# Patient Record
Sex: Female | Born: 1985 | Hispanic: Yes | Marital: Single | Smoking: Never smoker
Health system: Southern US, Community
[De-identification: ages and names within clinical notes are randomized; demographics above are authoritative.]

## PROBLEM LIST (undated history)

## (undated) HISTORY — PX: KNEE SURGERY: SHX244

---

## 2011-11-16 ENCOUNTER — Ambulatory Visit: Payer: Self-pay | Admitting: Family Medicine

## 2014-01-06 ENCOUNTER — Inpatient Hospital Stay: Payer: Self-pay | Admitting: Internal Medicine

## 2014-01-06 LAB — URINALYSIS, COMPLETE
BACTERIA: NONE SEEN
Bilirubin,UR: NEGATIVE
Glucose,UR: NEGATIVE mg/dL (ref 0–75)
Nitrite: NEGATIVE
Ph: 7 (ref 4.5–8.0)
Protein: 30
RBC,UR: 9 /HPF (ref 0–5)
SPECIFIC GRAVITY: 1.017 (ref 1.003–1.030)
Squamous Epithelial: 4
WBC UR: 43 /HPF (ref 0–5)

## 2014-01-06 LAB — COMPREHENSIVE METABOLIC PANEL
ALT: 16 U/L (ref 12–78)
Albumin: 3.8 g/dL (ref 3.4–5.0)
Alkaline Phosphatase: 58 U/L
Anion Gap: 6 — ABNORMAL LOW (ref 7–16)
BUN: 8 mg/dL (ref 7–18)
Bilirubin,Total: 0.7 mg/dL (ref 0.2–1.0)
CHLORIDE: 98 mmol/L (ref 98–107)
Calcium, Total: 9.2 mg/dL (ref 8.5–10.1)
Co2: 29 mmol/L (ref 21–32)
Creatinine: 0.95 mg/dL (ref 0.60–1.30)
EGFR (African American): >60
Glucose: 108 mg/dL — ABNORMAL HIGH (ref 65–99)
Osmolality: 265 (ref 275–301)
Potassium: 4 mmol/L (ref 3.5–5.1)
SGOT(AST): 14 U/L — ABNORMAL LOW (ref 15–37)
Sodium: 133 mmol/L — ABNORMAL LOW (ref 136–145)
Total Protein: 8.9 g/dL — ABNORMAL HIGH (ref 6.4–8.2)

## 2014-01-06 LAB — WET PREP, GENITAL

## 2014-01-06 LAB — CBC
HCT: 40.6 % (ref 35.0–47.0)
HGB: 13.1 g/dL (ref 12.0–16.0)
MCH: 29.6 pg (ref 26.0–34.0)
MCHC: 32.2 g/dL (ref 32.0–36.0)
MCV: 92 fL (ref 80–100)
PLATELETS: 265 10*3/uL (ref 150–440)
RBC: 4.42 10*6/uL (ref 3.80–5.20)
RDW: 12.6 % (ref 11.5–14.5)
WBC: 15.4 10*3/uL — ABNORMAL HIGH (ref 3.6–11.0)

## 2014-01-06 LAB — GC/CHLAMYDIA PROBE AMP

## 2014-01-07 LAB — CLOSTRIDIUM DIFFICILE(ARMC)

## 2014-01-08 LAB — CBC WITH DIFFERENTIAL/PLATELET
BASOS ABS: 0 10*3/uL (ref 0.0–0.1)
Basophil %: 0.4 %
Eosinophil #: 0 10*3/uL (ref 0.0–0.7)
Eosinophil %: 0.2 %
HCT: 33.7 % — ABNORMAL LOW (ref 35.0–47.0)
HGB: 11.3 g/dL — ABNORMAL LOW (ref 12.0–16.0)
Lymphocyte #: 2 10*3/uL (ref 1.0–3.6)
Lymphocyte %: 20.2 %
MCH: 30.6 pg (ref 26.0–34.0)
MCHC: 33.5 g/dL (ref 32.0–36.0)
MCV: 91 fL (ref 80–100)
MONOS PCT: 14.3 %
Monocyte #: 1.4 x10 3/mm — ABNORMAL HIGH (ref 0.2–0.9)
Neutrophil #: 6.4 10*3/uL (ref 1.4–6.5)
Neutrophil %: 64.9 %
PLATELETS: 230 10*3/uL (ref 150–440)
RBC: 3.69 10*6/uL — ABNORMAL LOW (ref 3.80–5.20)
RDW: 12.4 % (ref 11.5–14.5)
WBC: 9.9 10*3/uL (ref 3.6–11.0)

## 2014-01-09 LAB — URINE CULTURE

## 2014-01-10 LAB — STOOL CULTURE

## 2014-01-12 LAB — CULTURE, BLOOD (SINGLE)

## 2015-01-01 ENCOUNTER — Emergency Department
Admit: 2015-01-01 | Disposition: A | Discharge status: Home / Self Care | Payer: Self-pay | Admitting: Emergency Medicine

## 2015-01-01 LAB — COMPREHENSIVE METABOLIC PANEL
ALK PHOS: 50 U/L
Albumin: 4.7 g/dL
Anion Gap: 7 (ref 7–16)
BILIRUBIN TOTAL: 0.7 mg/dL
BUN: 14 mg/dL
CHLORIDE: 102 mmol/L
CREATININE: 0.77 mg/dL
Calcium, Total: 9.4 mg/dL
Co2: 25 mmol/L
EGFR (African American): >60
EGFR (Non-African Amer.): >60
GLUCOSE: 126 mg/dL — AB
Potassium: 3.8 mmol/L
SGOT(AST): 25 U/L
SGPT (ALT): 16 U/L
SODIUM: 134 mmol/L — AB
TOTAL PROTEIN: 8.5 g/dL — AB

## 2015-01-01 LAB — URINALYSIS, COMPLETE
Bilirubin,UR: NEGATIVE
Glucose,UR: NEGATIVE mg/dL (ref 0–75)
LEUKOCYTE ESTERASE: NEGATIVE
NITRITE: NEGATIVE
PH: 5 (ref 4.5–8.0)
PROTEIN: NEGATIVE
SPECIFIC GRAVITY: 1.02 (ref 1.003–1.030)

## 2015-01-01 LAB — CBC WITH DIFFERENTIAL/PLATELET
BASOS ABS: 0 10*3/uL (ref 0.0–0.1)
BASOS PCT: 0.3 %
EOS PCT: 0.1 %
Eosinophil #: 0 10*3/uL (ref 0.0–0.7)
HCT: 39.6 % (ref 35.0–47.0)
HGB: 13.6 g/dL (ref 12.0–16.0)
LYMPHS ABS: 0.9 10*3/uL — AB (ref 1.0–3.6)
LYMPHS PCT: 7.8 %
MCH: 30.5 pg (ref 26.0–34.0)
MCHC: 34.2 g/dL (ref 32.0–36.0)
MCV: 89 fL (ref 80–100)
MONOS PCT: 5.5 %
Monocyte #: 0.6 x10 3/mm (ref 0.2–0.9)
Neutrophil #: 9.9 10*3/uL — ABNORMAL HIGH (ref 1.4–6.5)
Neutrophil %: 86.3 %
Platelet: 266 10*3/uL (ref 150–440)
RBC: 4.45 10*6/uL (ref 3.80–5.20)
RDW: 12.7 % (ref 11.5–14.5)
WBC: 11.5 10*3/uL — AB (ref 3.6–11.0)

## 2015-01-01 LAB — LIPASE, BLOOD: LIPASE: 25 U/L

## 2015-01-01 LAB — TROPONIN I: Troponin-I: <0.03 ng/mL

## 2015-01-12 NOTE — H&P (Signed)
PATIENT NAMEMARAL, [name] MR#:  [id number] DATE OF BIRTH:  12/10/1985  DATE OF ADMISSION:  01/06/2014  REFERRING PHYSICIAN:  Dr. Joni Fears.  PRIMARY CARE PHYSICIAN:  Dr. Orlene Erm, from [facility] office.   CHIEF COMPLAINT:  Abdominal pain, flank pain, fever.   HISTORY OF PRESENT ILLNESS:  This is a 29 year old female without significant past medical history who presents with complaints of abdominal pain and flank pain and fever for the last two days, the patient reports she has been having diffuse abdominal pain, having fever and chills at home, as well she has been having nausea and vomiting, so far three episodes today, as well reports generalized weakness, loss of appetite, denies any diarrhea, any coffee-ground emesis, any cough, any productive sputum, reports dysuria and polyuria as well, the patient had blood work-up done in ED which did show evidence of leukocytosis at 15,000.  Urinalysis was positive was 43 white blood cells and +1 leukocyte esterase, the patient had CT abdomen and pelvis done which did show evidence of left-sided pyelonephritis, but no evidence of abscess or hydronephrosis, as well the patient had pelvic ultrasound done which did not show any acute finding as well, the patient had a pelvic exam done by the physician assistant which the patient did not have any cervical motion tenderness.  The patient continued to have nausea and vomiting in the ED despite receiving multiple treatments for nausea, so hospitalist requested to admit.   PAST MEDICAL HISTORY:  None.   PAST SURGICAL HISTORY:  Right knee surgery.   ALLERGIES:  No known drug allergies.   HOME MEDICATIONS:  None.   SOCIAL HISTORY:  The patient denies any smoking, any alcohol, any illicit drug use.   FAMILY HISTORY:  Denies any family history of diabetes or hypertension.   REVIEW OF SYSTEMS: CONSTITUTIONAL:  Reports fever, chills, weakness, poor appetite.  EYES:  Denies blurry vision, double  vision, inflammation, glaucoma.  EARS, NOSE, THROAT:  Denies tinnitus, ear pain, hearing loss, epistaxis or discharge.  RESPIRATORY:  Denies cough, wheezing, hemoptysis, dyspnea.  CARDIOVASCULAR:  Denies chest pain, orthopnea, edema, palpitations, syncope.  GASTROINTESTINAL:  Reports nausea, vomiting, abdominal pain.  Denies hematemesis, diarrhea, melena, or rectal bleed.  GENITOURINARY:  Reports dysuria, polyuria and renal colic.  Left flank pain.   ENDOCRINE:  Denies heat or cold intolerance.  Reports polyuria.  HEMATOLOGY:  Denies anemia, easy bruising, bleeding diathesis.  INTEGUMENTARY:  Denies acne, rash or skin lesion.  MUSCULOSKELETAL:  Denies any swelling, gout, arthritis, cramps.  NEUROLOGIC:  Denies any tremors, vertigo, ataxia, dysarthria, focal deficits.  Reports headache.  PSYCHIATRIC:  Denies anxiety, insomnia, depression, substance abuse, alcohol abuse.   PHYSICAL EXAMINATION: VITAL SIGNS:  Temperature 103.1, pulse 121, respiratory rate 20, blood pressure 120/73, saturating 100% on room air.  GENERAL:  Well-nourished female who looks comfortable in bed, in no apparent distress.  HEENT:  Head atraumatic, normocephalic.  Pupils equal, reactive to light.  Pink conjunctivae.  Anicteric sclerae.  Dry oral mucosa.  NECK:  Supple.  No thyromegaly.  No JVD.   CHEST:  Good air entry bilaterally.  No wheezing, rales, or rhonchi.  CARDIOVASCULAR:  S1, S2 heard.  No rubs, murmurs or gallops.  ABDOMEN:  Has diffuse abdominal tenderness and has bilateral CVA tenderness, but much more pronounced on the left than the right.  EXTREMITIES:  No edema.  No clubbing.  No cyanosis.  Radial and pedal pulses +2 bilaterally.  PSYCHIATRIC:  Appropriate affect.  Awake, alert x 3.  Intact judgment and insight.  NEUROLOGIC:  Cranial nerves grossly intact.  Motor 5 out of 5.  No focal deficits.  SKIN:  Dry, delayed skin turgor.  MUSCULOSKELETAL:  No joint effusion or erythema.   PERTINENT LABORATORY  DATA:  Glucose 108, BUN 8, creatinine 0.95, sodium 133, potassium 4, chloride 98, CO2 29, ALT 16, AST 14, alk phos 58.  White blood cell 15.4, hemoglobin 13.1, hematocrit 40.6, platelets 265.  Urinalysis showing 43 white blood cells, +1 leukocyte esterase.  CT abdomen and pelvis showing left acute pyelonephritis.  No abscess or hydronephrosis.  Pelvic ultrasound showing IUD in place within the endometrium, unremarkable study.   ASSESSMENT AND PLAN: 1.  Sepsis, the patient presents with leukocytosis, fever, and tachycardia, her source of sepsis is most likely related to acute pyelonephritis.  2.  Acute pyelonephritis, the patient will be started on aggressive intravenous fluid hydration and intravenous Rocephin.  We will follow on the urine culture, adjust antibiotics if needed.  3.  Dehydration, the patient appears to be clinically dehydrated.  We will have her on aggressive intravenous fluids.  4.  Nausea and vomiting, and abdominal, this is related to her acute pyelonephritis.  We will continue with as needed pain medicine and nausea medicine.  5.  Deep vein thrombosis prophylaxis.  SubQ heparin.  6.  CODE STATUS:  FULL CODE.   Total time spent on admission and patient care 35 minutes.    ____________________________ Albertine Patricia, MD dse:ea D: 01/06/2014 23:32:06 ET T: 01/07/2014 02:14:18 ET JOB#: [id number]  cc: Albertine Patricia, MD, <Dictator> Nicloe Frontera Graciela Husbands MD ELECTRONICALLY SIGNED 01/13/2014 1:01

## 2015-01-12 NOTE — Discharge Summary (Signed)
PATIENT NAMNevin Rodgers:  Stepp, Vana MR#:  [id number] DATE OF BIRTH:  05/17/1986  DATE OF ADMISSION:  01/06/2014 DATE OF DISCHARGE:  01/09/2014  DISCHARGE DIAGNOSES:  1.  Sepsis secondary to Escherichia coli urinary tract infection. Sepsis present on admission.  2.  Acute pyelonephritis.  3.  A furuncle in the right nostril.   DISCHARGE MEDICATIONS:  1.  Zofran 4 mg p.o. t.i.d. as needed.  2.  Amoxicillin 500 mg p.o. t.i.d.  3.  Fioricet 1 tablet every 12 hours as needed for headache.   CONSULTATIONS:  None.   HOSPITAL COURSE:  The patient is a 29 year old female patient who had abdominal pain, flank pain and fever. The patient had left-sided flank pain along with some nausea, vomiting, and generalized weakness and poor appetite. The patient had a white count of 15,000 and UA was positive for 43 WBCs and leukocyte esterase. A CT of the lung showed a left-sided pyelonephritis on admission. The patient did not have any evidence for hydronephrosis. She was admitted to the hospital care service because of her acute pyelonephritis.  she  had tachycardia with her heart rate 121 and temperature of 102.1 on admission, so she is admitted for sepsis likely due to pyelonephritis, started on IV Rocephin along with IV fluids and IV pain medications. The patient's symptoms nicely improved with antibiotics and IV fluids. Yesterday, she does not have any fever, but had some flank pain which improved in the next 2 days. The patient had some diarrhea in the hospital after the day of admission. The patient's stools were sent for Clostridium difficile, which were negative. The patient developed a furuncle in her right nostril, which was slightly tender to palpation. It did not look like abscess so the patient was given some warm compressions to the furuncle of  the nostril.  her symptoms nicely improved the following day. I discharged her home with antibiotics, amoxicillin, and her urine culture showed E. coli less than 80,000  colonies and she also has some migraines and we gave her a dose of Fioricet, which helped her. So I gave a prescription for Fioricet and I discharged her home in stable condition.   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  2.  Amoxicillin is given for 10 days.  3.  Fioricet is given for 5 days and I gave her 10 tablets.  4.   Zofran is given for 12 tablets.  The patient went home in stable condition. White count on discharge 9.9.   DISCHARGE PHYSICAL EXAMINATION:  CARDIOVASCULAR:   s1,s2 regular  LUNGS:  Clear to auscultation.  ABDOMEN:  Does not have any tenderness. No costovertebral angle tenderness and the patient has no hernias to exam.  NOSE: Still showed furuncle on the right side but nontender to palpation and redness has been resolved. VITAL SIGNS:  Heart rate was 50 and discharge temperature 97.7 and blood pressure 94/61.    DISPOSITION:  The patient went home in stable condition.   TIME SPENT:  More than 30 minutes.    ____________________________ Katha HammingSnehalatha Coren Sagan, MD sk:jm D: 01/13/2014 07:19:00 ET T: 01/13/2014 16:04:55 ET JOB#: [id number]  cc: Volney Americanhristina Drostin, MD  Katha HammingSNEHALATHA Anise Harbin MD ELECTRONICALLY SIGNED 01/23/2014 13:21

## 2015-01-12 NOTE — Discharge Summary (Signed)
PATIENT NAMNevin Rodgers:  Fleury, Vashti MR#:  [id number] DATE OF BIRTH:  1986-02-09  DATE OF ADMISSION:  01/06/2014 DATE OF DISCHARGE:  01/09/2014  DISCHARGE DIAGNOSES:  1.  Sepsis secondary to Escherichia coli urinary tract infection. Sepsis present on admission.  2.  Acute pyelonephritis.  3.  A furuncle in the right nostril.   DISCHARGE MEDICATIONS:  1.  Zofran 4 mg p.o. t.i.d. as needed.  2.  Amoxicillin 500 mg p.o. t.i.d.  3.  Fioricet 1 tablet every 12 hours as needed for headache.   CONSULTATIONS:  None.   HOSPITAL COURSE:  The patient is a 29 year old female patient who had abdominal pain, flank pain and fever. The patient had left-sided flank pain along with some nausea, vomiting, and generalized weakness and poor appetite. The patient had a white count of 15,000 and UA was positive for 43 WBCs and leukocyte esterase. A CT of the lung showed a left-sided pyelonephritis on admission. The patient did not have any evidence for hydronephrosis. She was admitted to the hospital care service because of her acute pyelonephritis. Her labs were showing a little kidney function but (Dictation Anomaly)<<MISSING TEXT>> and she also had tachycardia with her heart rate 121 and temperature of 102.1 on admission, so she is admitted for sepsis likely due to pyelonephritis, started on IV Rocephin along with IV fluids and IV pain medications. The patient's symptoms nicely improved with antibiotics and IV fluids. Yesterday, she does not have any fever, but had some flank pain which improved in the next 2 days. The patient had some diarrhea in the hospital after the day of admission. The patient's stools were sent for Clostridium difficile, which were negative. The patient developed a furuncle in her right nostril, which was slightly tender to palpation. It did not look like abscess so the patient was given some warm compressions to the furuncle on the nostril but (Dictation Anomaly) <<MISSING TEXT>> did well and her symptoms  nicely improved the following day. I discharged her home with antibiotics, amoxicillin, and her urine culture showed E. coli less than 80,000 colonies and she also has some migraines and we gave her a dose of Fioricet, which helped her. So I gave a prescription for Fioricet and I discharged her home in stable condition.   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  2.  Amoxicillin is given for 10 days.  3.  Fioricet is given for 5 days and I gave her 10 tablets.  4.   Zofran is given for 12 tablets.  The patient went home in stable condition. White count on discharge 9.9.   DISCHARGE PHYSICAL EXAMINATION:  CARDIOVASCULAR:   (Dictation Anomaly) <<MISSING TEXT>>.  LUNGS:  Clear to auscultation.  ABDOMEN:  Does not have any tenderness. No costovertebral angle tenderness and the patient has no hernias to exam.  NOSE: Still showed furuncle on the right side but nontender to palpation and redness has been resolved. VITAL SIGNS:  Heart rate was 50 and discharge temperature 97.7 and blood pressure 94/61.    DISPOSITION:  The patient went home in stable condition.   TIME SPENT:  More than 30 minutes.    ____________________________ Katha HammingSnehalatha Philomena Buttermore, MD sk:jm D: 01/13/2014 07:19:00 ET T: 01/13/2014 16:04:55 ET JOB#: [id number]  cc: Katha HammingSnehalatha Abdur Hoglund, MD, <Dictator> Volney Americanhristina Drostin, MD

## 2015-04-30 ENCOUNTER — Other Ambulatory Visit: Payer: Self-pay

## 2015-04-30 ENCOUNTER — Emergency Department: Payer: BLUE CROSS/BLUE SHIELD

## 2015-04-30 ENCOUNTER — Encounter: Payer: Self-pay | Admitting: Urgent Care

## 2015-04-30 ENCOUNTER — Emergency Department
Admission: EM | Admit: 2015-04-30 | Discharge: 2015-05-01 | Disposition: A | Discharge status: Home / Self Care | Payer: BLUE CROSS/BLUE SHIELD | Attending: Emergency Medicine | Admitting: Emergency Medicine

## 2015-04-30 DIAGNOSIS — Z3202 Encounter for pregnancy test, result negative: Secondary | ICD-10-CM | POA: Diagnosis not present

## 2015-04-30 DIAGNOSIS — B349 Viral infection, unspecified: Secondary | ICD-10-CM | POA: Insufficient documentation

## 2015-04-30 DIAGNOSIS — R079 Chest pain, unspecified: Secondary | ICD-10-CM | POA: Insufficient documentation

## 2015-04-30 LAB — BASIC METABOLIC PANEL
Anion gap: 10 (ref 5–15)
BUN: 15 mg/dL (ref 6–20)
CO2: 24 mmol/L (ref 22–32)
Calcium: 9.4 mg/dL (ref 8.9–10.3)
Chloride: 102 mmol/L (ref 101–111)
Creatinine, Ser: 1.03 mg/dL — ABNORMAL HIGH (ref 0.44–1.00)
GFR calc Af Amer: >60 mL/min (ref >60)
GFR calc non Af Amer: >60 mL/min (ref >60)
Glucose, Bld: 119 mg/dL — ABNORMAL HIGH (ref 65–99)
POTASSIUM: 3.6 mmol/L (ref 3.5–5.1)
SODIUM: 136 mmol/L (ref 135–145)

## 2015-04-30 LAB — TROPONIN I: Troponin I: <0.03 ng/mL (ref <0.031)

## 2015-04-30 LAB — CBC
HCT: 37.7 % (ref 35.0–47.0)
HEMOGLOBIN: 12.9 g/dL (ref 12.0–16.0)
MCH: 30.4 pg (ref 26.0–34.0)
MCHC: 34.2 g/dL (ref 32.0–36.0)
MCV: 88.7 fL (ref 80.0–100.0)
Platelets: 239 10*3/uL (ref 150–440)
RBC: 4.25 MIL/uL (ref 3.80–5.20)
RDW: 12.9 % (ref 11.5–14.5)
WBC: 12 10*3/uL — AB (ref 3.6–11.0)

## 2015-04-30 MED ORDER — ACETAMINOPHEN 325 MG PO TABS
650.0000 mg | ORAL_TABLET | Freq: Once | ORAL | Status: AC
  Administered 2015-04-30: 650 mg via ORAL
  Filled 2015-04-30: qty 2

## 2015-04-30 MED ORDER — ONDANSETRON HCL 4 MG PO TABS: 4.0000 mg | ORAL_TABLET | Freq: Every day | ORAL | Status: AC | PRN

## 2015-04-30 MED ORDER — ONDANSETRON HCL 4 MG PO TABS
4.0000 mg | ORAL_TABLET | Freq: Once | ORAL | Status: AC
  Administered 2015-05-01: 4 mg via ORAL
  Filled 2015-04-30: qty 1

## 2015-04-30 MED ORDER — GI COCKTAIL ~~LOC~~
30.0000 mL | Freq: Once | ORAL | Status: AC
  Administered 2015-04-30: 30 mL via ORAL
  Filled 2015-04-30: qty 30

## 2015-04-30 NOTE — ED Provider Notes (Signed)
[facility] Emergency Department Provider Note  ____________________________________________  Time seen: Approximately 950 PM  I have reviewed the triage vital signs and the nursing notes.   HISTORY  Chief Complaint Emesis; Fever; and Chest Pain     HPI [name] is a 29 y.o. female without pertinent medical history who presents with 3 days of midsternal, sharp chest pain which radiates to her back. She says that she is also feeling intermittent palpitations and pain with deep breathing. She says that she also has associated nausea and vomiting and a runny nose with fever. Denies any diarrhea. No blood in the vomitus. Max temp was 101. No known sick contacts or recent antibiotics.   History reviewed. No pertinent past medical history.  There are no active problems to display for this patient.   Past Surgical History  Procedure Laterality Date  . Knee surgery Right     No current outpatient prescriptions on file.  Allergies Review of patient's allergies indicates no known allergies.  No family history on file.  Social History History  Substance Use Topics  . Smoking status: Never Smoker   . Smokeless tobacco: Not on file  . Alcohol Use: No    Review of Systems Constitutional: Reports fever Eyes: No visual changes. ENT: No sore throat. Cardiovascular: As above  Respiratory: Denies shortness of breath. Gastrointestinal: No abdominal pain.  No nausea, no vomiting.  No diarrhea.  No constipation. Genitourinary: Negative for dysuria. Musculoskeletal: Negative for back pain. Skin: Negative for rash. Neurological: Negative for headaches, focal weakness or numbness.  10-point ROS otherwise negative.  ____________________________________________   PHYSICAL EXAM:  VITAL SIGNS: ED Triage Vitals  Enc Vitals Group     BP 04/30/15 2135 107/69 mmHg     Pulse Rate 04/30/15 2135 106     Resp 04/30/15 2135 20     Temp 04/30/15 2135 99.8 F  (37.7 C)     Temp Source 04/30/15 2135 Oral     SpO2 04/30/15 2135 99 %     Weight 04/30/15 2135 138 lb (62.596 kg)     Height 04/30/15 2135 5\' 3"  (1.6 m)     Head Cir --      Peak Flow --      Pain Score 04/30/15 2136 10     Pain Loc --      Pain Edu? --      Excl. in GC? --     Constitutional: Alert and oriented. Well appearing and in no acute distress. Eyes: Conjunctivae are normal. PERRL. EOMI. Head: Atraumatic. Nose: No congestion/rhinnorhea. Mouth/Throat: Mucous membranes are moist.  Oropharynx non-erythematous. Neck: No stridor.   Cardiovascular: Normal rate, regular rhythm. Heart rate 93 at the bedside. Grossly normal heart sounds.  Good peripheral circulation. Chest pain is reproducible over the sternum to palpation. Respiratory: Normal respiratory effort.  No retractions. Lungs CTAB. Gastrointestinal: Soft and nontender. No distention. No abdominal bruits. No CVA tenderness. Musculoskeletal: No lower extremity tenderness nor edema.  No joint effusions. Neurologic:  Normal speech and language. No gross focal neurologic deficits are appreciated. No gait instability. Skin:  Skin is warm, dry and intact. No rash noted. Psychiatric: Mood and affect are normal. Speech and behavior are normal.  ____________________________________________   LABS (all labs ordered are listed, but only abnormal results are displayed)  Labs Reviewed  BASIC METABOLIC PANEL - Abnormal; Notable for the following:    Glucose, Bld 119 (*)    Creatinine, Ser 1.03 (*)    All  other components within normal limits  CBC - Abnormal; Notable for the following:    WBC 12.0 (*)    All other components within normal limits  TROPONIN I   ____________________________________________  EKG  ED ECG REPORT I, Schaevitz,  Teena Irani, the attending physician, personally viewed and interpreted this ECG.   Date: 04/30/2015  EKG Time: 2139  Rate: 101  Rhythm: sinus tachycardia  Axis: Normal axis   Intervals:none  ST&T Change: No ST elevations or depressions. T-wave inversions in 3, aVF and V3. No previous for comparison.    ____________________________________________  RADIOLOGY  No active cardiopulmonary disease on the chest x-ray. I personally reviewed the images. ____________________________________________   PROCEDURES    ____________________________________________   INITIAL IMPRESSION / ASSESSMENT AND PLAN / ED COURSE  Pertinent labs & imaging results that were available during my care of the patient were reviewed by me and considered in my medical decision making (see chart for details).  ----------------------------------------- 11:33 PM on 04/30/2015 -----------------------------------------  Try patient with GI cocktail without any relief of her pain. Likely viral syndrome causing constellation of symptoms. However, cannot rule out pulmonary embolus. Pending urinary pregnancy test as well as d-dimer at this time. Signed out to Dr. Dolores Frame. Nonspecific EKG changes on her chest x-ray. Unclear significance but doubt coronary artery disease. Furthermore, has a negative troponin. Would suspect elevated troponin after multiple days of chest pain. No history of cardiac disease in the family. ____________________________________________   FINAL CLINICAL IMPRESSION(S) / ED DIAGNOSES  Acute viral syndrome. Acute chest pain. Initial visit.    Myrna Blazer, MD 04/30/15 [phone]

## 2015-04-30 NOTE — ED Notes (Addendum)
Patient presents with c/o central CP that began earlier today. Chest is tender to palpation per her report. (+) N/V and fever also reports; tmax 101. (+) SOB.

## 2015-05-01 LAB — HEPATIC FUNCTION PANEL
ALT: 13 U/L — AB (ref 14–54)
AST: 23 U/L (ref 15–41)
Albumin: 4.4 g/dL (ref 3.5–5.0)
Alkaline Phosphatase: 49 U/L (ref 38–126)
Bilirubin, Direct: <0.1 mg/dL — ABNORMAL LOW (ref 0.1–0.5)
Total Bilirubin: 0.4 mg/dL (ref 0.3–1.2)
Total Protein: 8.1 g/dL (ref 6.5–8.1)

## 2015-05-01 LAB — LIPASE, BLOOD: LIPASE: 16 U/L — AB (ref 22–51)

## 2015-05-01 LAB — POCT PREGNANCY, URINE: PREG TEST UR: NEGATIVE

## 2015-05-01 LAB — FIBRIN DERIVATIVES D-DIMER (ARMC ONLY): Fibrin derivatives D-dimer (ARMC): 401 (ref 0–499)

## 2015-05-01 NOTE — ED Provider Notes (Signed)
-----------------------------------------   00:45 AM on 05/01/2015 -----------------------------------------  Apologized for delay in lab results secondary to computer downtime. Patient is currently resting in no acute distress. Remains afebrile.  ----------------------------------------- 1:58 AM on 05/01/2015 -----------------------------------------  Updated patient and family of lab results. Negative d-dimer, LFTs and lipase unremarkable. Strict return precautions given. Both verbalize understanding and agree with plan of care.  Irean Hong, MD 05/01/15 [phone]

## 2015-05-01 NOTE — ED Notes (Signed)
md in with pt

## 2016-02-23 ENCOUNTER — Encounter: Payer: Self-pay | Admitting: Gynecology

## 2016-02-23 ENCOUNTER — Ambulatory Visit (INDEPENDENT_AMBULATORY_CARE_PROVIDER_SITE_OTHER): Payer: BLUE CROSS/BLUE SHIELD

## 2016-02-23 ENCOUNTER — Ambulatory Visit
Admission: EM | Admit: 2016-02-23 | Discharge: 2016-02-23 | Disposition: A | Discharge status: Home / Self Care | Payer: BLUE CROSS/BLUE SHIELD | Attending: Family Medicine | Admitting: Family Medicine

## 2016-02-23 DIAGNOSIS — M436 Torticollis: Secondary | ICD-10-CM | POA: Insufficient documentation

## 2016-02-23 DIAGNOSIS — R509 Fever, unspecified: Secondary | ICD-10-CM

## 2016-02-23 LAB — URINALYSIS COMPLETE WITH MICROSCOPIC (ARMC ONLY)
Bilirubin Urine: NEGATIVE
Glucose, UA: NEGATIVE mg/dL
Nitrite: NEGATIVE
Protein, ur: NEGATIVE mg/dL
Specific Gravity, Urine: <1.005 — ABNORMAL LOW (ref 1.005–1.030)
pH: 6.5 (ref 5.0–8.0)

## 2016-02-23 LAB — RAPID INFLUENZA A&B ANTIGENS
Influenza A (ARMC): NEGATIVE
Influenza B (ARMC): NEGATIVE

## 2016-02-23 LAB — RAPID STREP SCREEN (MED CTR MEBANE ONLY): Streptococcus, Group A Screen (Direct): NEGATIVE

## 2016-02-23 MED ORDER — IBUPROFEN 400 MG PO TABS
400.0000 mg | ORAL_TABLET | Freq: Once | ORAL | Status: AC
  Administered 2016-02-23: 400 mg via ORAL

## 2016-02-23 NOTE — ED Provider Notes (Signed)
CSN: [id number]     Arrival date & time 02/23/16  1318 History   First MD Initiated Contact with Patient 02/23/16 1505    Nurses notes were reviewed. Chief Complaint  Patient presents with  . Generalized Body Aches   Mother and patient reports having a high fever like this about 2 years ago she had a polynephritis and one up in the hospital for about 4 days. She reports Friday started running a fever. Her coworkers thought that she looked ill. She has had body aches and myalgia and a sore throat. She denies any cough burning on urination and vaginal discharge or dyspareunia. Her last menstrual period was about a week ago and normal. She is a gravida 1 para is 1. She denies any cough congestion and denies any dysuria or frequency at this time unlike 2 years ago when she was admitted for polynephritis.  She's had knee surgery and no other surgery she does not smoke. No significant or pertinent family medical history relating to this visit no known drug allergies.   (Consider location/radiation/quality/duration/timing/severity/associated sxs/prior Treatment) Patient is a 30 y.o. female presenting with fever. The history is provided by the patient and a parent. No language interpreter was used.  Fever Max temp prior to arrival:  103 Temp source:  Oral Duration:  3 days Progression:  Worsening Relieved by:  Nothing Associated symptoms: chills, myalgias, nausea and sore throat   Associated symptoms: no chest pain, no confusion, no congestion, no cough, no dysuria, no ear pain, no rash, no rhinorrhea and no vomiting   Myalgias:    Location:  Generalized   Quality:  Aching   Onset quality:  Sudden   Progression:  Worsening Risk factors: no hx of cancer, no immunosuppression, no occupational exposure, no recent surgery, no recent travel and no sick contacts     History reviewed. No pertinent past medical history. Past Surgical History  Procedure Laterality Date  . Knee surgery Right    No  family history on file. Social History  Substance Use Topics  . Smoking status: Never Smoker   . Smokeless tobacco: None  . Alcohol Use: No   OB History    No data available     Review of Systems  Constitutional: Positive for fever and chills.  HENT: Positive for sore throat. Negative for congestion, ear pain and rhinorrhea.   Respiratory: Negative for cough.   Cardiovascular: Negative for chest pain.  Gastrointestinal: Positive for nausea. Negative for vomiting.  Genitourinary: Negative for dysuria, urgency, hematuria, decreased urine volume, vaginal bleeding, vaginal discharge, difficulty urinating, vaginal pain, menstrual problem and pelvic pain.  Musculoskeletal: Positive for myalgias.  Skin: Negative for color change, pallor and rash.  Neurological: Negative for tremors and weakness.  Psychiatric/Behavioral: Negative for confusion.  All other systems reviewed and are negative.   Allergies  Review of patient's allergies indicates no known allergies.  Home Medications   Prior to Admission medications   Medication Sig Start Date End Date Taking? Authorizing Provider  ibuprofen (ADVIL,MOTRIN) 200 MG tablet Take 200 mg by mouth every 6 (six) hours as needed.    Historical Provider, MD  ondansetron (ZOFRAN) 4 MG tablet Take 1 tablet (4 mg total) by mouth daily as needed for nausea or vomiting. 04/30/15   Myrna Blazeravid Matthew Schaevitz, MD   Meds Ordered and Administered this Visit   Medications  ibuprofen (ADVIL,MOTRIN) tablet 400 mg (400 mg Oral Given 02/23/16 1439)    BP 136/121 mmHg  Pulse 111  Temp(Src)  100.2 F (37.9 C) (Oral)  Resp 16  Ht  (1.6 m)  Wt 134 lb (60.782 kg)  BMI 23.74 kg/m2  SpO2 100%  LMP 02/13/2016 No data found.   Physical Exam  Constitutional: She is oriented to person, place, and time. She appears well-developed and well-nourished.  HENT:  Head: Normocephalic and atraumatic.  Right Ear: External ear normal.  Left Ear: External ear normal.   Eyes: Pupils are equal, round, and reactive to light.  Neck: Normal range of motion. Neck supple.  Cardiovascular: Normal rate and regular rhythm.   Pulmonary/Chest: Effort normal and breath sounds normal.  Abdominal: Soft. Bowel sounds are normal. She exhibits no distension.  Musculoskeletal: Normal range of motion.  Lymphadenopathy:    She has cervical adenopathy.  Neurological: She is alert and oriented to person, place, and time.  Skin: Skin is warm.  Psychiatric: She has a normal mood and affect.  Vitals reviewed.   ED Course  Procedures (including critical care time)  Labs Review Labs Reviewed  URINALYSIS COMPLETEWITH MICROSCOPIC ([facility] ONLY) - Abnormal; Notable for the following:    Color, Urine STRAW (*)    Ketones, ur 2+ (*)    Specific Gravity, Urine <1.005 (*)    Hgb urine dipstick 2+ (*)    Leukocytes, UA TRACE (*)    Bacteria, UA FEW (*)    Squamous Epithelial / LPF 6-30 (*)    All other components within normal limits  RAPID INFLUENZA A&B ANTIGENS ([facility] ONLY)  RAPID STREP SCREEN (NOT AT [facility])  CULTURE, GROUP A STREP [facility])  URINE CULTURE    Imaging Review Dg Chest 2 View  02/23/2016  CLINICAL DATA:  Fever for 3 days EXAM: CHEST  2 VIEW COMPARISON:  April 30, 2015 FINDINGS: There is a calcified granuloma in the left upper lobe, stable. There is no edema or consolidation. Heart size and pulmonary vascularity are normal. No adenopathy. No bone lesions. IMPRESSION: Small calcified granuloma left upper lobe. No edema or consolidation. Electronically Signed   By: Bretta Bang III M.D.   On: 02/23/2016 16:08     Visual Acuity Review  Right Eye Distance:   Left Eye Distance:   Bilateral Distance:    Right Eye Near:   Left Eye Near:    Bilateral Near:     Results for orders placed or performed during the hospital encounter of 02/23/16  Rapid Influenza A&B Antigens ([facility] only)  Result Value Ref Range   Influenza A ([facility]) NEGATIVE NEGATIVE   Influenza B  ([facility]) NEGATIVE NEGATIVE  Rapid strep screen  Result Value Ref Range   Streptococcus, Group A Screen (Direct) NEGATIVE NEGATIVE  Urinalysis complete, with microscopic  Result Value Ref Range   Color, Urine STRAW (A) YELLOW   APPearance CLEAR CLEAR   Glucose, UA NEGATIVE NEGATIVE mg/dL   Bilirubin Urine NEGATIVE NEGATIVE   Ketones, ur 2+ (A) NEGATIVE mg/dL   Specific Gravity, Urine <1.005 (L) 1.005 - 1.030   Hgb urine dipstick 2+ (A) NEGATIVE   pH 6.5 5.0 - 8.0   Protein, ur NEGATIVE NEGATIVE mg/dL   Nitrite NEGATIVE NEGATIVE   Leukocytes, UA TRACE (A) NEGATIVE   RBC / HPF 0-5 0 - 5 RBC/hpf   WBC, UA 6-30 0 - 5 WBC/hpf   Bacteria, UA FEW (A) NONE SEEN   Squamous Epithelial / LPF 6-30 (A) NONE SEEN      MDM   1. Stiffness of neck   2. Fever of unknown origin  After examination only finding pain in her neck and some stiffness of the neck. Examination I discussed with the mother need or the suggestion to go ahead and go to the ED for spinal tap and evaluation for meningitis. It was at that time the mother is now for me that a year ago she had a similar presentation spinal tap but no etiology was found and she was sent home and did fine. Explained to the mother even though this bowel tap was unremarkable a year ago meningitis can be fatal and can be quickly fatal. At this time the only thing we can find that significant is the neck stiffness and neck pain. I was still suggest a spinal tap. However after discussion with her and her mother I will do a urine and a chest x-ray. If no signs of a cause of fever can be found my suggestion will be to go to the ED.   Chest x-ray showed an old granuloma and urinalysis was unremarkable except for ketones but she is very told me that she did not have an appetite and she's not eating. Unfortunately she had a significant amount of epithelial cells so the bacteria that she has may not be significant. Will culture urine the mother is now asked what  would I suggest and has before I suggest they go to one of the [facility] hospitals for probable spinal tap. They're now willing to go to [facility] for further evaluation.  I decided not to do CBC because whether the CBC is elevated or low would recommend at this time LP.  Note: This dictation was prepared with Dragon dictation along with smaller phrase technology. Any transcriptional errors that result from this process are unintentional.  Hassan Rowan, MD 02/23/16 1655

## 2016-02-23 NOTE — ED Notes (Signed)
Patient c/o chills and body ache x 3 days.

## 2016-02-23 NOTE — Discharge Instructions (Signed)
[employer] (Fever, Adult) La fiebre es el aumento de la Arts development officer. A menudo se la define como una temperatura de 100F (38C) o ms. Los episodios de fiebre leve o moderada que duran poco tiempo no tienen efectos a Air cabin crew y, en general, tampoco requieren TEFL teacher. Los episodios de fiebre moderada o alta pueden resultar molestos. A veces, tambin pueden ser un signo de una enfermedad grave. La sudoracin que se puede presentar cuando los episodios de fiebre son reiterados o duran algn tiempo tambin pueden llevar a que falte lquido en el organismo (deshidratacin). Puede usar un termmetro para tomarse la temperatura y saber si tiene fiebre. La medicin de la temperatura puede cambiar en funcin de los siguientes factores:  La edad.  La hora del da.  El lugar donde se coloca el termmetro:  La boca (oral).  El recto (rectal).  El odo (timpnico).  Debajo del brazo Administrator, Civil Service).  La frente (temporal). CUIDADOS EN EL HOGAR Est atento a cualquier cambio en los sntomas. Tome estas medidas para controlar la afeccin:  Tome los medicamentos de venta libre y los recetados solamente como se lo haya indicado el mdico. Siga cuidadosamente las indicaciones en cuanto a la dosis.  Si le recetaron un antibitico, tmelo como se lo haya indicado el mdico. No deje de tomar los antibiticos aunque comience a Actor.  Descanse todo lo que sea necesario.  Beba suficiente lquido para mantener el pis (orina) claro o de color amarillo plido.  Higiencese con Delma Freeze o dese un bao con agua a temperatura ambiente, si es necesario. Esto ayuda a Teaching laboratory technician. No use agua helada.  No se tape con muchas mantas ni use ropa abrigada. SOLICITE AYUDA SI:  Vomita.  No puede comer o tomar lquido sin vomitar.  La materia fecal es lquida (diarrea).  Siente dolor al [employer].  Los sntomas no mejoran con Scientist, research (medical).  Aparecen nuevos  sntomas.  Se siente muy dbil. SOLICITE AYUDA DE INMEDIATO SI:  Siente falta de aire o dificultad para respirar.  Est mareado o se desvanece (se desmaya).  Se siente confundido.  Tiene signos de falta de lquido en el organismo, por ejemplo:  Sequedad en la boca.  Menor cantidad [place].  Palidez.  Tiene dolor muy intenso de vientre (abdomen).  No deja de vomitar o las heces son lquidas.  Le aparece una erupcin cutnea.  Los sntomas empeoran repentinamente.   Esta informacin no tiene Theme park manager el consejo del mdico. Asegrese de hacerle al mdico cualquier pregunta que tenga.   Document Released: 06/01/2012 Document Revised: 05/29/2015 Elsevier Interactive Patient Education 2016 [employer].  Puncin lumbar (Lumbar Puncture) La puncin lumbar es un procedimiento mediante el cual se extrae y examina una pequea cantidad del lquido que rodea el cerebro y la mdula espinal. Este lquido se llama lquido cefalorraqudeo. Este procedimiento se Dentist para lo siguiente:   Ayudar a Dentist, como la meningitis, la encefalitis, la esclerosis mltiple y [place].  Drenar lquido y Technical sales engineer presin que se presenta con ciertos tipos de cefaleas.  Detectar hemorragias en las zonas del cerebro y la mdula espinal (sistema nervioso central).  Administrar medicamentos en el lquido cefalorraqudeo. INFORME A SU MDICO:  Cualquier alergia que tenga.  Todos los Walt Disney, incluidos vitaminas, hierbas, gotas oftlmicas, cremas y [address] de [address].  Problemas previos que usted o los [employer] de su familia hayan tenido con el uso de anestsicos.  Enfermedades de Clear Channel Communicationsla sangre.  Cirugas previas.  Enfermedades. RIESGOS Y COMPLICACIONES En general, se trata de un procedimiento seguro. Sin embargo, Tree surgeoncomo en cualquier procedimiento, pueden surgir complicaciones. Las complicaciones posibles son:   Cefalea por  puncin lumbar. Es una cefalea grave que se presenta cuando hay prdida de lquido cefalorraqudeo. Una cefalea por puncin lumbar provoca malestar pero no es peligrosa. Si persiste, se puede realizar otro procedimiento para tratar la cefalea.  Hemorragias. Generalmente ocurre en personas con trastornos hemorrgicos. Se trata de enfermedades en las que la sangre no coagula normalmente.  Infeccin en el lugar de la insercin que se puede extender hasta el hueso o el lquido cefalorraqudeo.  Formacin de un tumor en la mdula espinal (poco frecuente).  Hernia cerebral o desplazamiento del cerebro a la mdula espinal (poco frecuente).  Imposibilidad para moverse (extremadamente raro). ANTES DEL PROCEDIMIENTO  Posiblemente deba realizarse anlisis de [place] Estatessangre. Estos anlisis pueden ayudar a Contractordeterminar la eficacia con la que estn funcionando los riones y English as a second language teacherel hgado. Tambin pueden determinar la eficacia con la que coagula la Pattisonsangre.  Si toma anticoagulantes, pregntele al mdico si debe dejar de tomarlos y cundo.  El mdico puede indicarle que se realice una tomografa computarizada (TC) del cerebro.  Pdale a alguna persona que lo lleve a su casa despus del procedimiento. PROCEDIMIENTO  Se lo colocar en una posicin que permita que los espacios entre los huesos de la columna (vrtebras) se separen todo lo posible. Esta posicin facilitar el pasaje de la aguja hasta el canal espinal.  Segn su edad y tamao, puede yacer de costado, con las rodillas cerca del mentn. O bien, puede sentarse con la cabeza sobre una almohada colocada a la altura de la cintura.  Se limpiar la piel que recubre la parte inferior de la espalda (o regin lumbar).  Posiblemente se adormezca la piel con medicamentos.  Probablemente le administren un medicamento para el dolor o para ayudarlo a relajarse (sedante).  Luego se inserta una pequea aguja, hasta que ingresa en el espacio que contiene el lquido  cefalorraqudeo. La aguja no ingresar a la mdula espinal.  El lquido cefalorraqudeo se Landscape architectrecolectar en tubos.  Se retirar la aguja y se colocar un apsito en la zona. DESPUS DEL PROCEDIMIENTO  Permanecer recostado durante 1hora o durante el tiempo que sugiera el mdico.  El lquido cefalorraqudeo se enviar a un laboratorio para que sea examinado. Los Norfolk Southernresultados del examen posiblemente estn listos antes de que se vaya a su casa.  Si el mdico cree que tiene una infeccin, se Holiday representativeextraer material del lquido cefalorraqudeo para hacer una prueba llamada cultivo. Si se extrajo un cultivo para exmenes, los resultados generalmente estarn listos en un par Kinder Morgan Energyde das.   Esta informacin no tiene Theme park managercomo fin reemplazar el consejo del mdico. Asegrese de hacerle al mdico cualquier pregunta que tenga.   Document Released: 06/17/2005 Document Revised: 06/28/2013 Elsevier Interactive Patient Education [employer]2016 Elsevier Inc.

## 2016-02-25 LAB — CULTURE, GROUP A STREP (THRC)

## 2016-02-26 LAB — URINE CULTURE: Special Requests: NORMAL

## 2017-01-25 IMAGING — CR DG CHEST 2V
1 series · 3 of 3 positions shown · non-contrast
Comparison: None.

CLINICAL DATA: Chest pain, shortness of breath.

EXAM:
CHEST  2 VIEW

[Series 1: dg chest 2 view · 0.14mm/px · 3 of 3 slices shown]
[im 1/3]
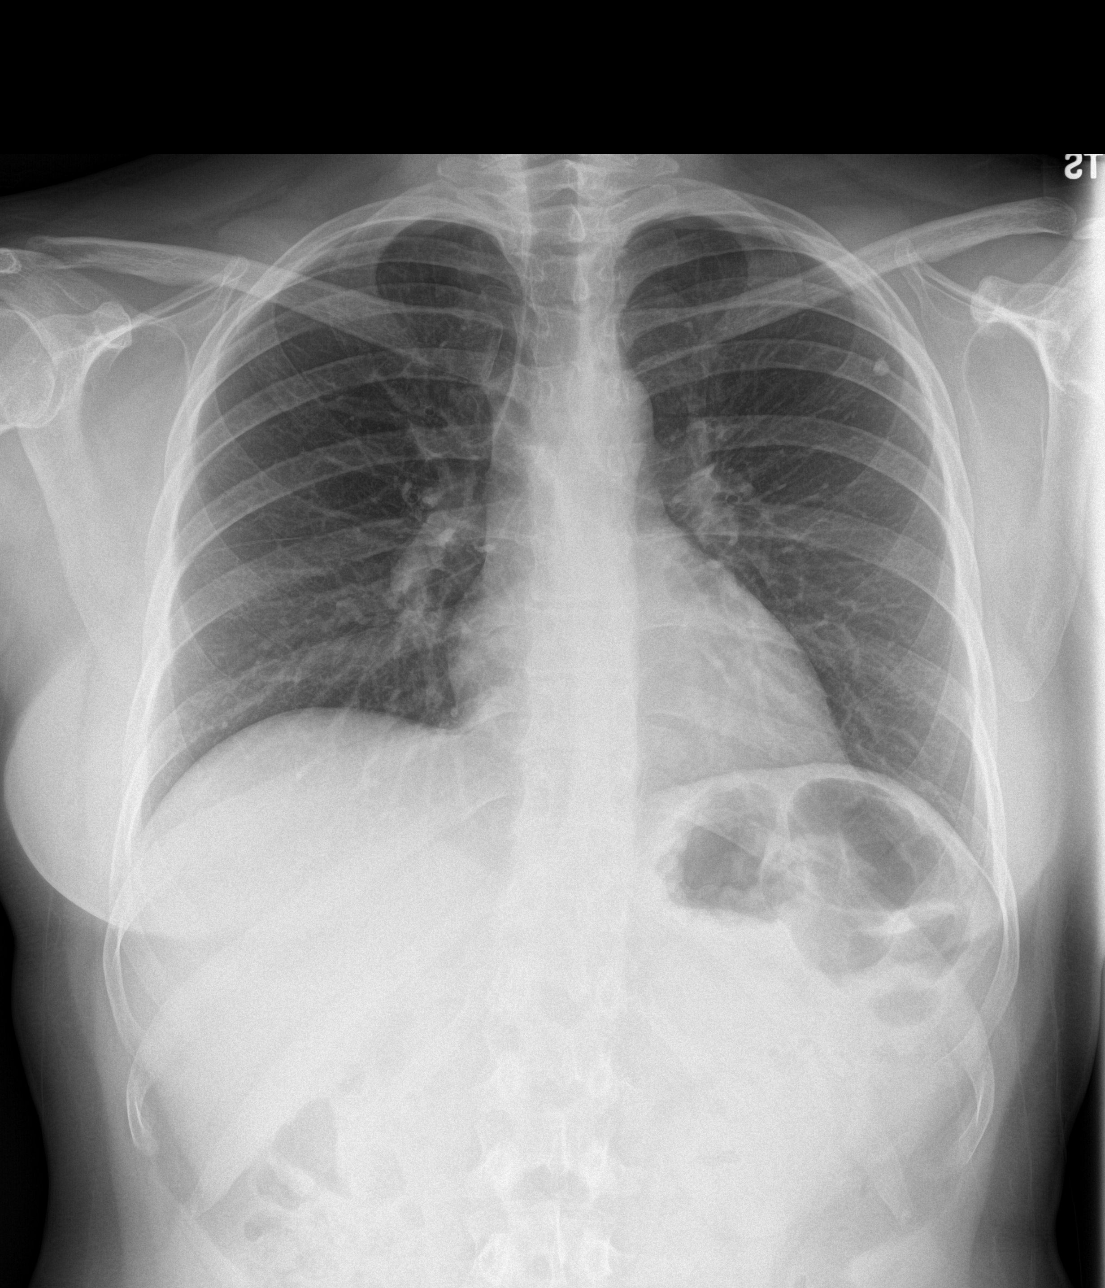
[im 2/3]
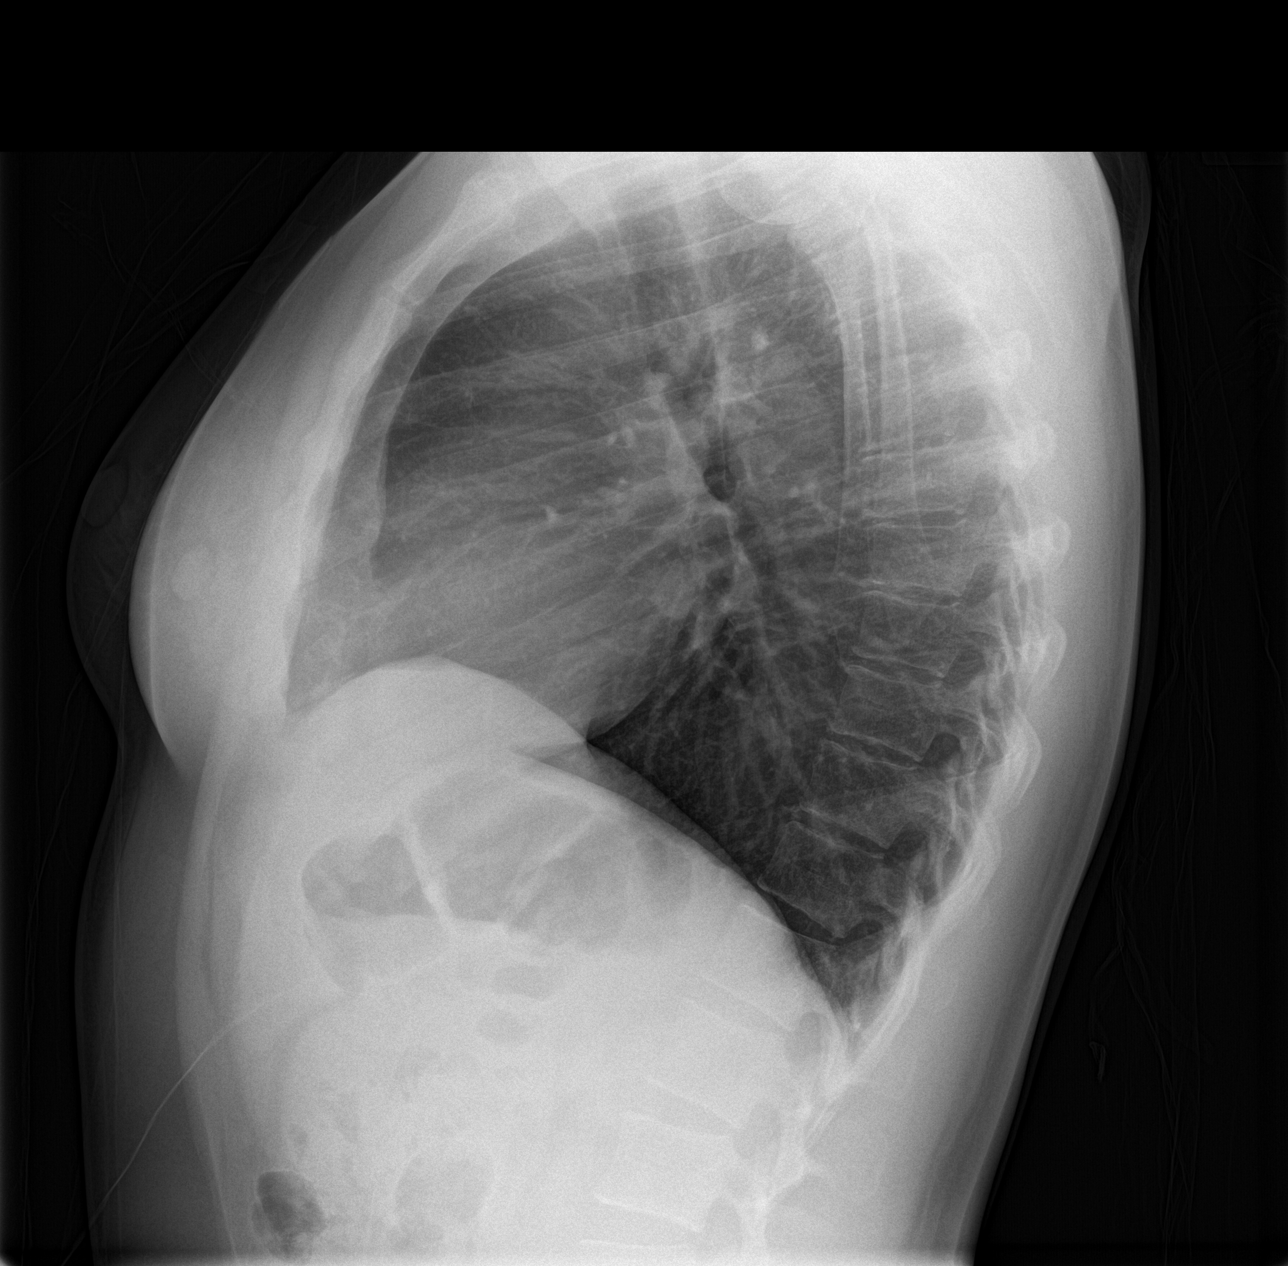
[im 3/3]
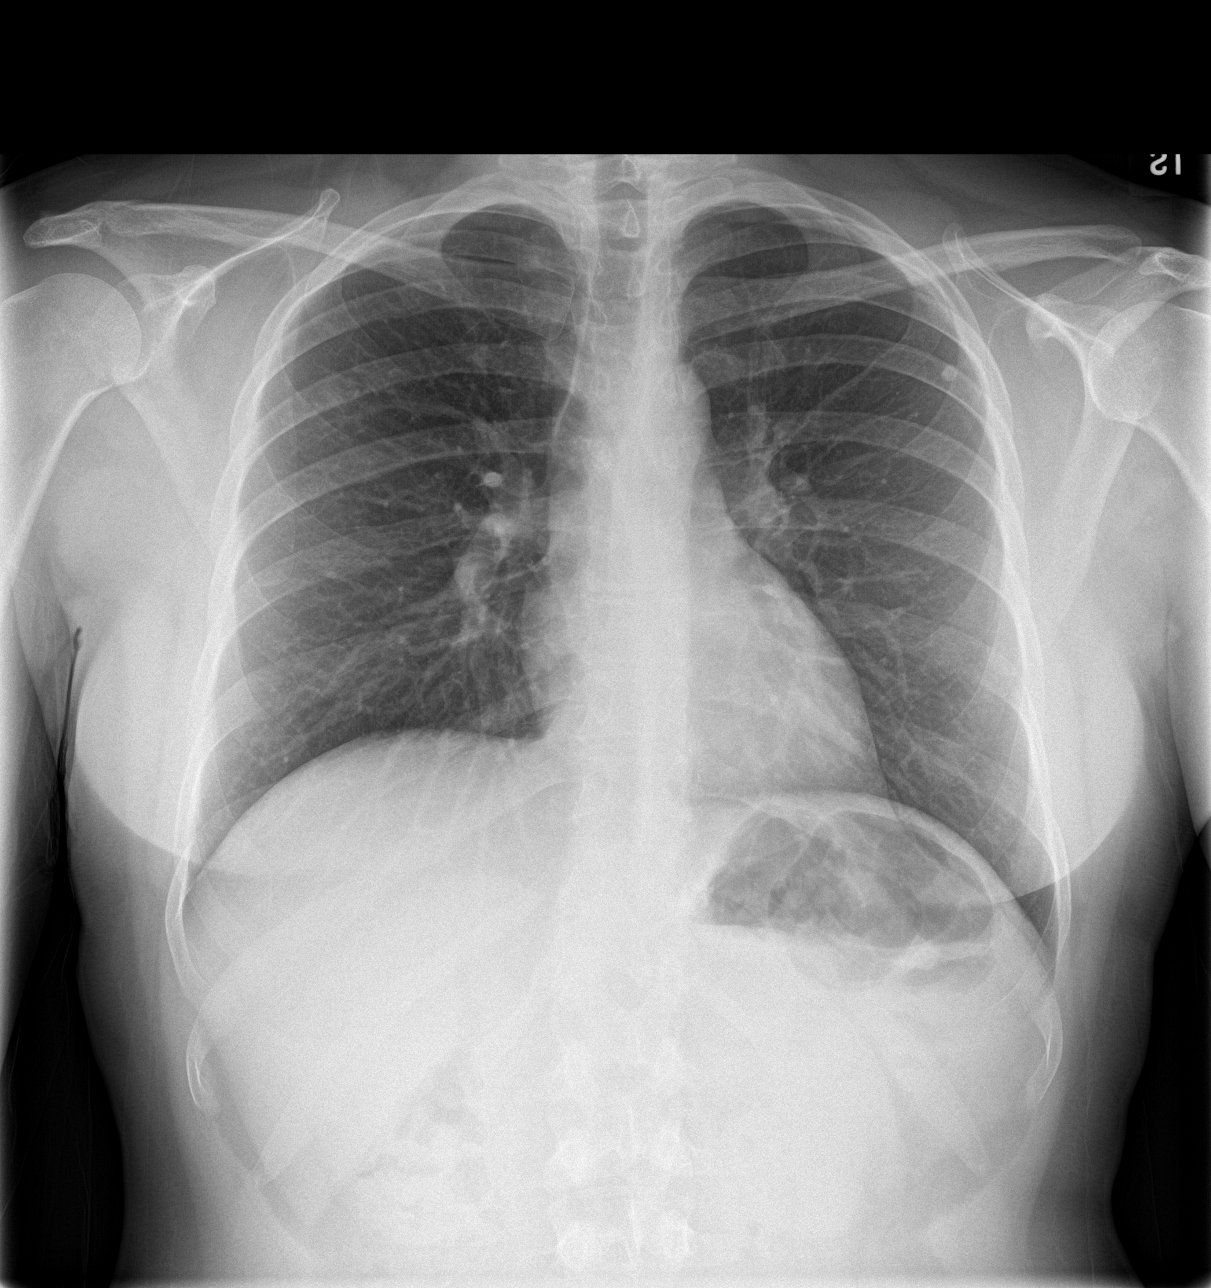

[3 of 3 positions shown; findings below may reference images not displayed]

FINDINGS: The heart size and mediastinal contours are within normal limits. No
pneumothorax or pleural effusion is noted. Calcified granuloma is
noted in left upper lobe. No acute pulmonary disease is noted. The
visualized skeletal structures are unremarkable.
IMPRESSION: No active cardiopulmonary disease.

## 2017-11-20 IMAGING — CR DG CHEST 2V
2 series · 2 of 2 positions shown · non-contrast
Comparison: April 30, 2015

CLINICAL DATA: Fever for 3 days

EXAM:
CHEST  2 VIEW

[chest pa]
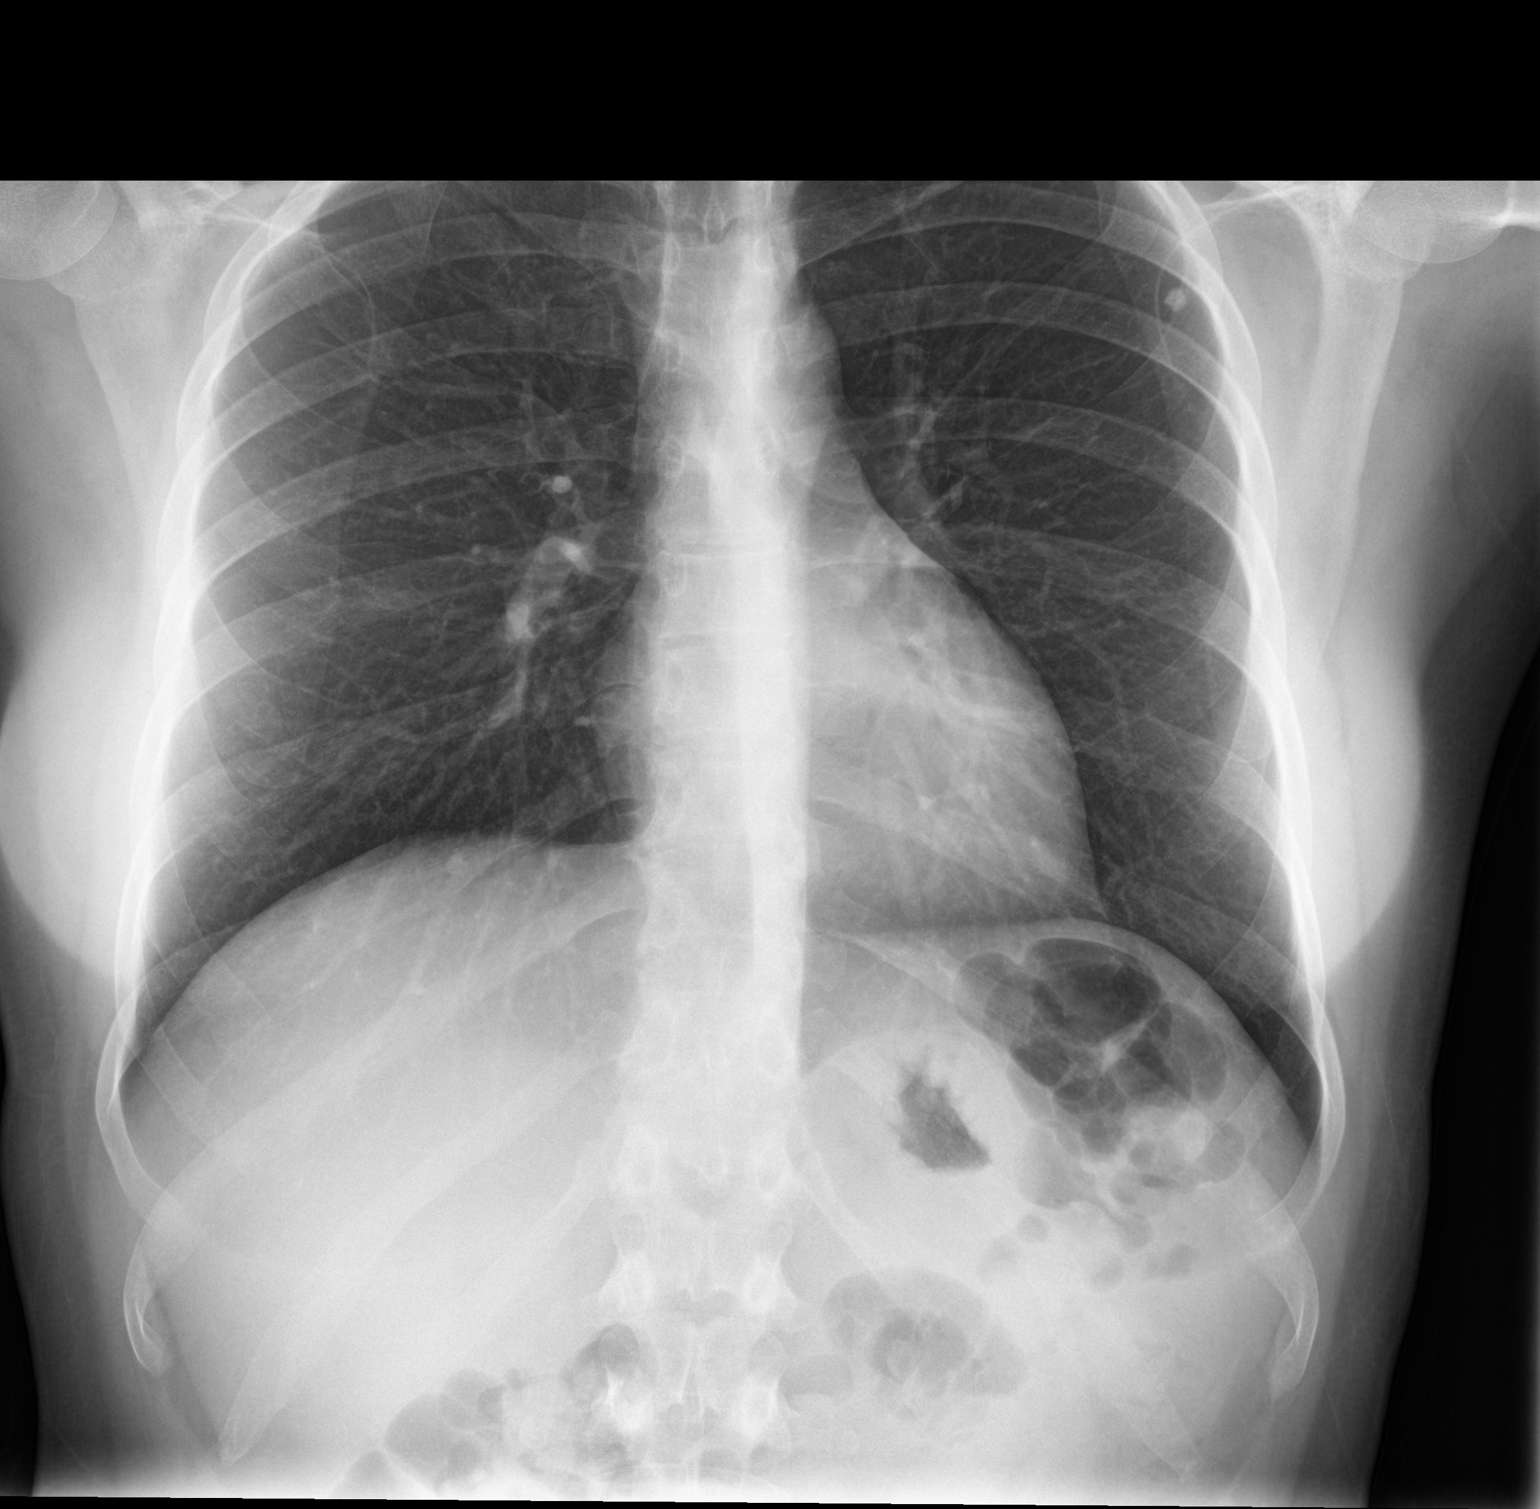

[chest lat]
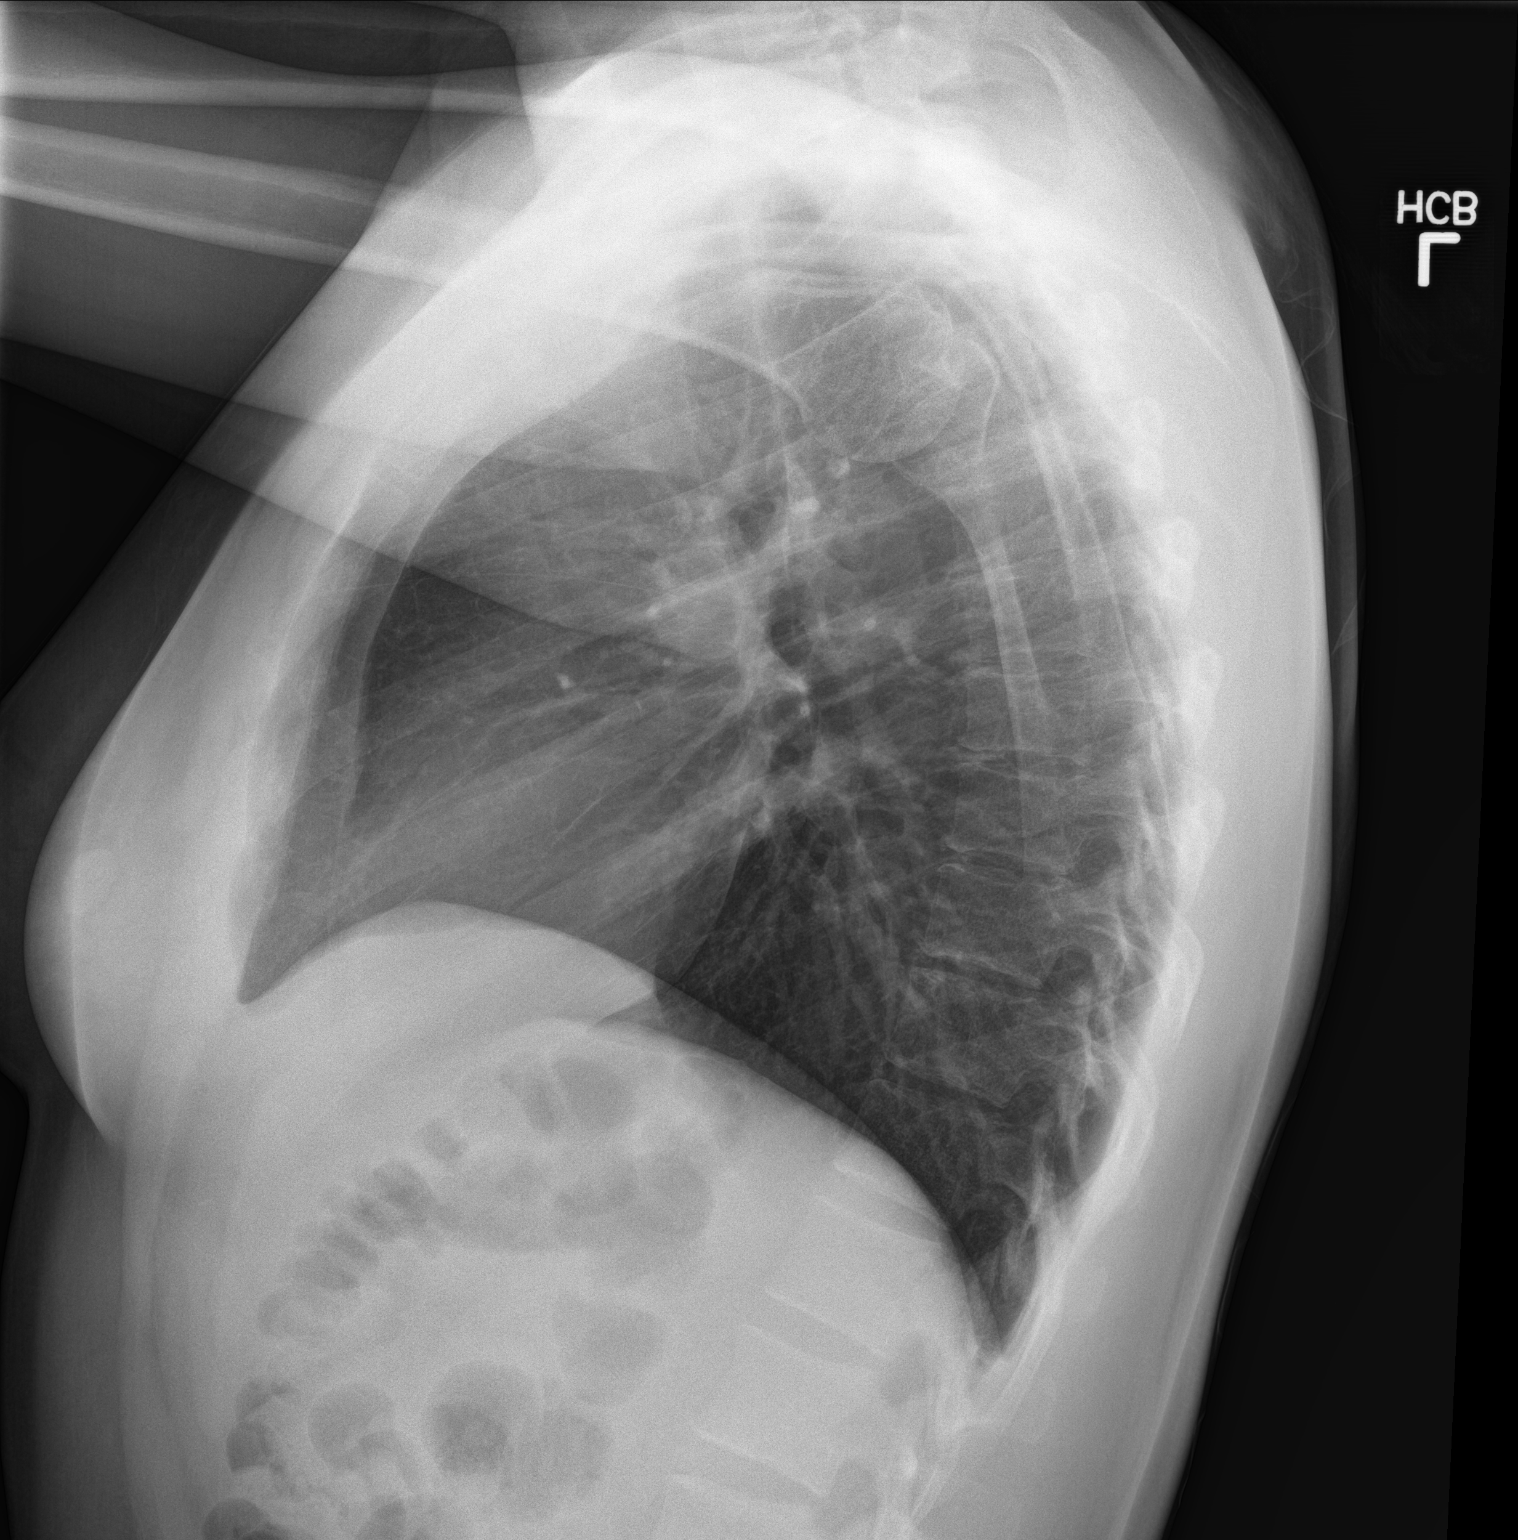

[2 of 2 positions shown; findings below may reference images not displayed]

FINDINGS: There is a calcified granuloma in the left upper lobe, stable. There
is no edema or consolidation. Heart size and pulmonary vascularity
are normal. No adenopathy. No bone lesions.
IMPRESSION: Small calcified granuloma left upper lobe. No edema or
consolidation.

## 2019-05-09 ENCOUNTER — Other Ambulatory Visit: Payer: Self-pay

## 2019-05-09 DIAGNOSIS — Z20822 Contact with and (suspected) exposure to covid-19: Secondary | ICD-10-CM

## 2019-05-10 LAB — NOVEL CORONAVIRUS, NAA: SARS-CoV-2, NAA: NOT DETECTED

## 2019-12-22 ENCOUNTER — Ambulatory Visit: Payer: BLUE CROSS/BLUE SHIELD | Attending: Internal Medicine

## 2019-12-22 DIAGNOSIS — Z23 Encounter for immunization: Secondary | ICD-10-CM

## 2019-12-22 NOTE — Progress Notes (Signed)
   Covid-19 Vaccination Clinic  Name:  Tarea Skillman    MRN: [id number] DOB: 15-Jan-1986  12/22/2019  Ms. Trammel was observed post Covid-19 immunization for 15 minutes without incident. She was provided with Vaccine Information Sheet and instruction to access the V-Safe system.   Ms. Luddy was instructed to call 911 with any severe reactions post vaccine: Marland Kitchen Difficulty breathing  . Swelling of face and throat  . A fast heartbeat  . A bad rash all over body  . Dizziness and weakness   Immunizations Administered    Name Date Dose VIS Date Route   Pfizer COVID-19 Vaccine 12/22/2019 11:21 AM 0.3 mL 09/01/2019 Intramuscular   Manufacturer: [employer], [employer]   Lot: [id number]   NDC: [address]-4

## 2020-01-16 ENCOUNTER — Ambulatory Visit: Payer: BLUE CROSS/BLUE SHIELD | Attending: Internal Medicine

## 2020-01-16 DIAGNOSIS — Z23 Encounter for immunization: Secondary | ICD-10-CM

## 2020-01-16 NOTE — Progress Notes (Signed)
   Covid-19 Vaccination Clinic  Name:  [name]    MRN: [id number] DOB: Aug 04, 1986  01/16/2020  [name] was observed post Covid-19 immunization for 15 minutes without incident. She was provided with Vaccine Information Sheet and instruction to access the V-Safe system.   [name] was instructed to call 911 with any severe reactions post vaccine: Marland Kitchen Difficulty breathing  . Swelling of face and throat  . A fast heartbeat  . A bad rash all over body  . Dizziness and weakness   Immunizations Administered    Name Date Dose VIS Date Route   Pfizer COVID-19 Vaccine 01/16/2020  4:31 PM 0.3 mL 11/15/2018 Intramuscular   Manufacturer: [employer], [employer]   Lot: [id number]   NDC: [address]-4

## 2020-06-28 ENCOUNTER — Encounter: Payer: Self-pay | Admitting: Emergency Medicine

## 2020-06-28 ENCOUNTER — Other Ambulatory Visit: Payer: Self-pay

## 2020-06-28 ENCOUNTER — Ambulatory Visit
Admission: EM | Admit: 2020-06-28 | Discharge: 2020-06-28 | Disposition: A | Discharge status: Home / Self Care | Payer: BLUE CROSS/BLUE SHIELD

## 2020-06-28 DIAGNOSIS — H73891 Other specified disorders of tympanic membrane, right ear: Secondary | ICD-10-CM | POA: Diagnosis present

## 2020-06-28 NOTE — ED Triage Notes (Signed)
Patient states that she thinks that she might have a bug in her right ear that she noticed yesterday.  Patient states that she can feel something moving around in her right ear.

## 2020-06-28 NOTE — Discharge Instructions (Addendum)
Thank you for coming in to see [place] today! Please see below to review our plan for today's visit:  You are experiencing something called "Tensor Tympani Syndrome" which is a spasm of a muscle in your ear that can cause a sensation and sound of fluttering in the ear. It is completely harmless but can be very annoying. There was no foreign body or bug in your ear and no sign of infection was seen on physical exam.  It was our pleasure to serve you!   Dr. Peggyann Shoals [facility]

## 2020-06-28 NOTE — ED Provider Notes (Signed)
[facility]    CSN: [id number] Arrival date & time: 06/28/20  1106    History   Chief Complaint Chief Complaint  Patient presents with  . Foreign Body in Ear    right    HPI [name] is a 34 y.o. female presenting to urgent care with concerns for a "bug in her right ear" that she noticed yesterday.  She reports that she can feel something moving around in her right ear.  No ear drainage, bleeding, hearing loss. No headaches.  HPI  History reviewed. No pertinent past medical history.  Patient Active Problem List   Diagnosis Date Noted  . 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 KNEE SURGERY Right     OB History   No obstetric history on file.      Home Medications    Prior to Admission medications   Medication Sig Start Date End Date Taking? Authorizing Provider  ibuprofen (ADVIL,MOTRIN) 200 MG tablet Take 200 mg by mouth every 6 (six) hours as needed.    [provider]  ondansetron (ZOFRAN) 4 MG tablet Take 1 tablet (4 mg total) by mouth daily as needed for nausea or vomiting. 04/30/15   Pershing Proud, Myra Rude, MD    Family History Family History  Problem Relation Age of Onset  . Healthy Mother   . Healthy Father     Social History Social History   Tobacco Use  . Smoking status: Never Smoker  . Smokeless tobacco: Never Used  Vaping Use  . Vaping Use: Never used  Substance Use Topics  . Alcohol use: No  . Drug use: Not on file     Allergies   Patient has no known allergies.   Review of Systems Review of Systems - see HPI   Physical Exam Triage Vital Signs ED Triage Vitals  Enc Vitals Group     BP      Pulse      Resp      Temp      Temp src      SpO2      Weight      Height      Head Circumference      Peak Flow      Pain Score      Pain Loc      Pain Edu?      Excl. in GC?    No data found.  Updated Vital Signs BP 111/79 (BP  Location: Left Arm)   Pulse 60   Temp 98.4 F (36.9 C) (Oral)   Resp 14   Ht 5\' 3"  (1.6 m)   Wt 134 lb (60.8 kg)   LMP 06/27/2020 (Exact Date)   SpO2 100%   BMI 23.74 kg/m   Physical exam: General: Well-appearing patient, somewhat anxious, very pleasant HEENT: Normal-appearing external ear and canal without cerumen, no foreign body appreciated, tympanic membranes intact with normal markers and cone of light without erythema or bulging Respiratory: Speaking complete sentences, comfortable work of breathing    UC Treatments / Results  Labs (all labs ordered are listed, but only abnormal results are displayed) Labs Reviewed - No data to display  EKG   Radiology No results found.  Procedures Procedures (including critical care time)  Medications Ordered in UC Medications - No data to display  Initial Impression / Assessment and Plan / UC Course  I have reviewed the triage vital signs and  the nursing notes.  Pertinent labs & imaging results that were available during my care of the patient were reviewed by me and considered in my medical decision making (see chart for details).  Tensor tympani syndrome, right: Patient with sensation of foreign body and "fluttering" in her right ear.  No foreign body or bug appreciated to patient's ear on today's visit. -Patient is likely experiencing tensor tympani syndrome, educated about this syndrome and reassured that while it can be annoying it is harmless and does not need to hearing loss -Can follow with PCP as needed -No medications prescribed   Final Clinical Impressions(s) / UC Diagnoses   Final diagnoses:  Tensor tympani spasm disorder, right     Discharge Instructions     Thank you for coming in to see [place] today! Please see below to review our plan for today's visit:  You are experiencing something called "Tensor Tympani Syndrome" which is a spasm of a muscle in your ear that can cause a sensation and sound of  fluttering in the ear. It is completely harmless but can be very annoying. There was no foreign body or bug in your ear and no sign of infection was seen on physical exam.  It was our pleasure to serve you!   Dr. Peggyann Shoals [facility]     ED Prescriptions    None     PDMP not reviewed this encounter.    Peggyann Shoals, DO [facility], PGY-3 06/28/2020 11:53 AM    Dollene Cleveland, DO 06/28/20 1156
